# Patient Record
Sex: Female | Born: 1965 | Race: Black or African American | Hispanic: No | Marital: Single | Smoking: Never smoker
Health system: Southern US, Community
[De-identification: ages and names within clinical notes are randomized; demographics above are authoritative.]

## PROBLEM LIST (undated history)

## (undated) DIAGNOSIS — I10 Essential (primary) hypertension: Secondary | ICD-10-CM

## (undated) HISTORY — PX: SKIN BIOPSY: SHX1

## (undated) HISTORY — PX: TUBAL LIGATION: SHX77

## (undated) HISTORY — PX: KNEE SURGERY: SHX244

---

## 2002-04-24 ENCOUNTER — Encounter: Admission: RE | Admit: 2002-04-24 | Discharge: 2002-05-08 | Payer: Self-pay | Admitting: Specialist

## 2009-04-29 ENCOUNTER — Other Ambulatory Visit: Admission: RE | Admit: 2009-04-29 | Discharge: 2009-04-29 | Payer: Self-pay | Admitting: Obstetrics and Gynecology

## 2009-06-22 ENCOUNTER — Emergency Department (HOSPITAL_BASED_OUTPATIENT_CLINIC_OR_DEPARTMENT_OTHER): Admission: EM | Admit: 2009-06-22 | Discharge: 2009-06-22 | Payer: Self-pay | Admitting: Emergency Medicine

## 2010-03-11 ENCOUNTER — Emergency Department (HOSPITAL_BASED_OUTPATIENT_CLINIC_OR_DEPARTMENT_OTHER): Admission: EM | Admit: 2010-03-11 | Discharge: 2010-03-12 | Payer: Self-pay | Admitting: Emergency Medicine

## 2010-03-11 ENCOUNTER — Ambulatory Visit: Payer: Self-pay | Admitting: Diagnostic Radiology

## 2010-04-07 DIAGNOSIS — R079 Chest pain, unspecified: Secondary | ICD-10-CM | POA: Insufficient documentation

## 2010-04-07 DIAGNOSIS — H109 Unspecified conjunctivitis: Secondary | ICD-10-CM | POA: Insufficient documentation

## 2010-12-29 LAB — COMPREHENSIVE METABOLIC PANEL
ALT: 10 U/L (ref 0–35)
AST: 23 U/L (ref 0–37)
BUN: 11 mg/dL (ref 6–23)
Calcium: 8.8 mg/dL (ref 8.4–10.5)
GFR calc non Af Amer: >60 mL/min (ref >60)
Glucose, Bld: 95 mg/dL (ref 70–99)
Total Bilirubin: 0.7 mg/dL (ref 0.3–1.2)
Total Protein: 7.6 g/dL (ref 6.0–8.3)

## 2010-12-29 LAB — CBC
MCHC: 33.1 g/dL (ref 30.0–36.0)
MCV: 84.6 fL (ref 78.0–100.0)
RDW: 13.1 % (ref 11.5–15.5)

## 2010-12-29 LAB — DIFFERENTIAL
Basophils Relative: 0 % (ref 0–1)
Eosinophils Absolute: 0.2 10*3/uL (ref 0.0–0.7)
Eosinophils Relative: 2 % (ref 0–5)
Lymphs Abs: 2.9 10*3/uL (ref 0.7–4.0)
Monocytes Absolute: 0.4 10*3/uL (ref 0.1–1.0)
Monocytes Relative: 5 % (ref 3–12)

## 2010-12-29 LAB — POCT CARDIAC MARKERS
CKMB, poc: <1 ng/mL — ABNORMAL LOW (ref 1.0–8.0)
Troponin i, poc: <0.05 ng/mL (ref 0.00–0.09)

## 2010-12-29 LAB — URINALYSIS, ROUTINE W REFLEX MICROSCOPIC
Glucose, UA: NEGATIVE mg/dL
Leukocytes, UA: NEGATIVE
Specific Gravity, Urine: 1.018 (ref 1.005–1.030)

## 2010-12-29 LAB — URINE MICROSCOPIC-ADD ON

## 2011-08-25 IMAGING — CR DG CHEST 2V
2 series · 2 of 2 positions shown · non-contrast
Comparison: None.

CLINICAL DATA: Left shoulder pain.

CHEST - 2 VIEW

[w chest pa]
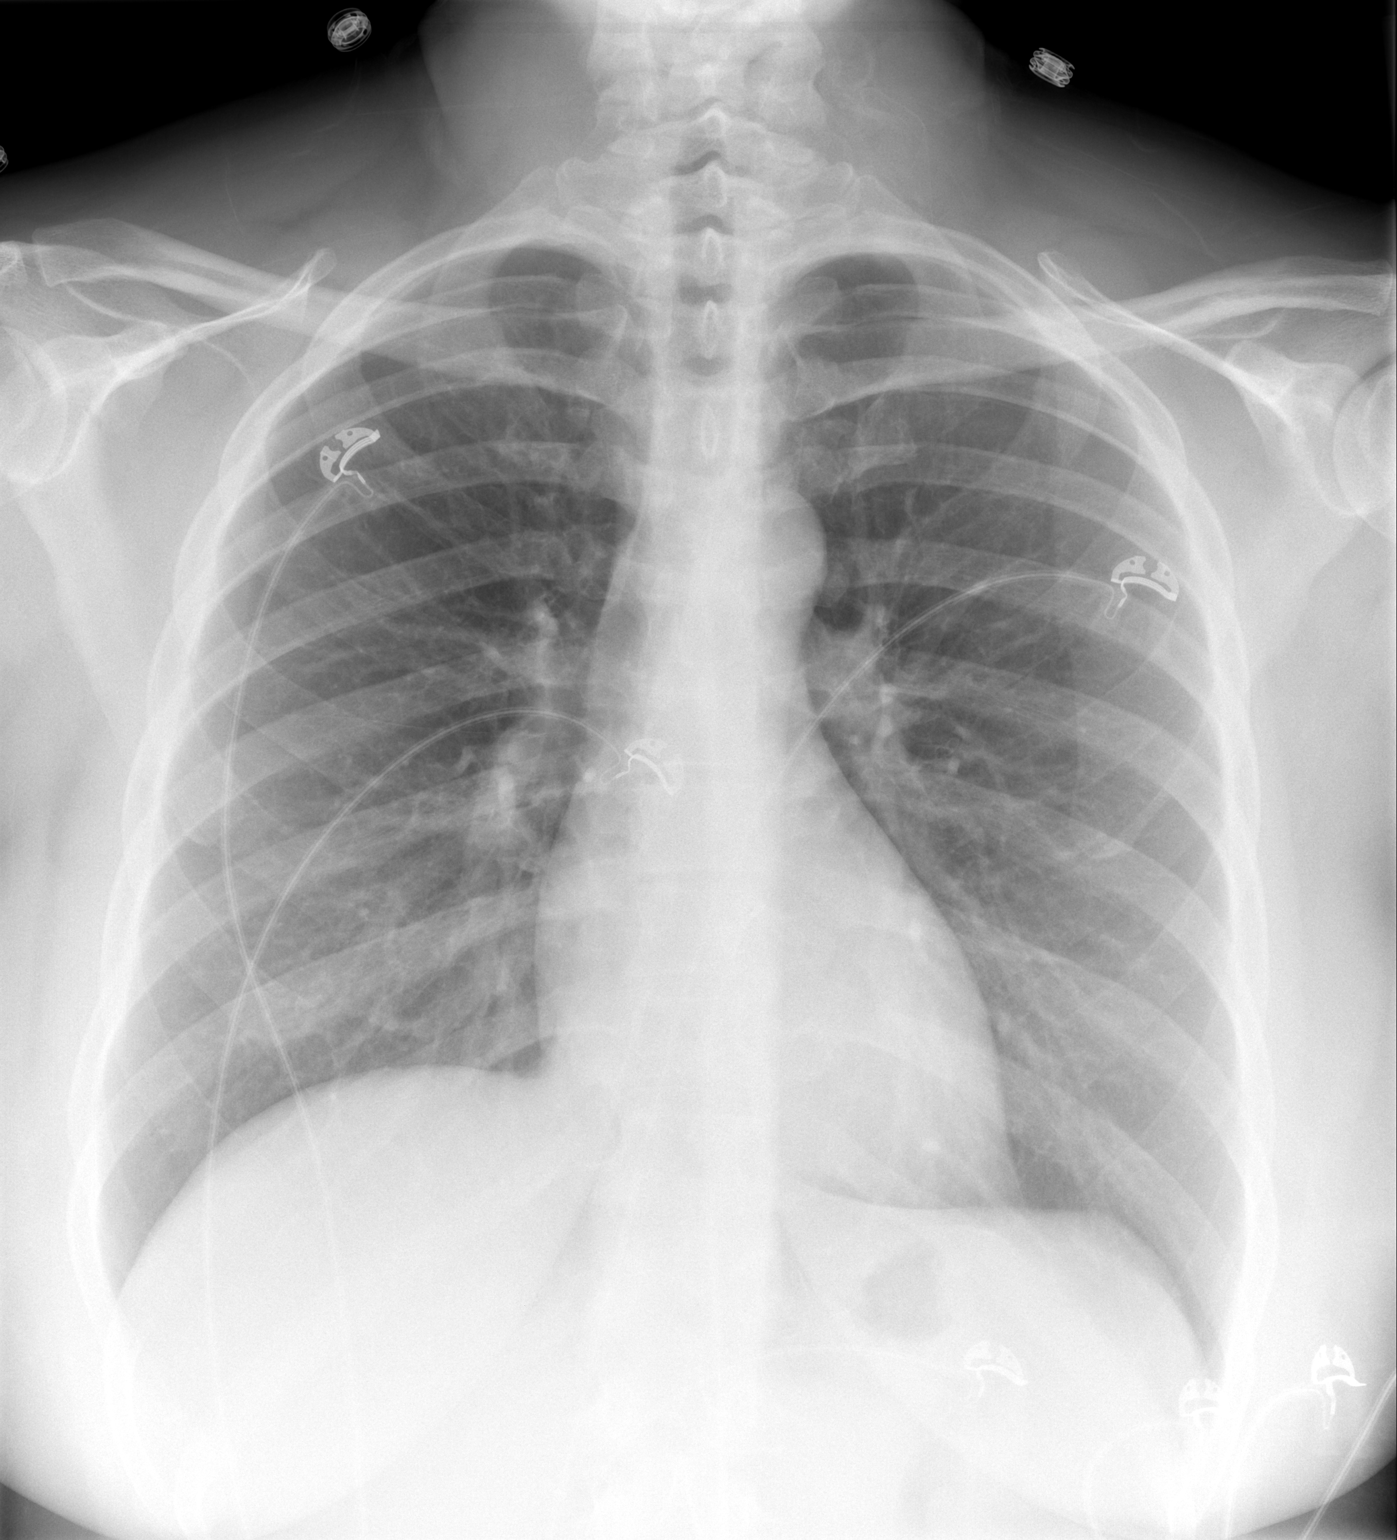

[w chest lat]
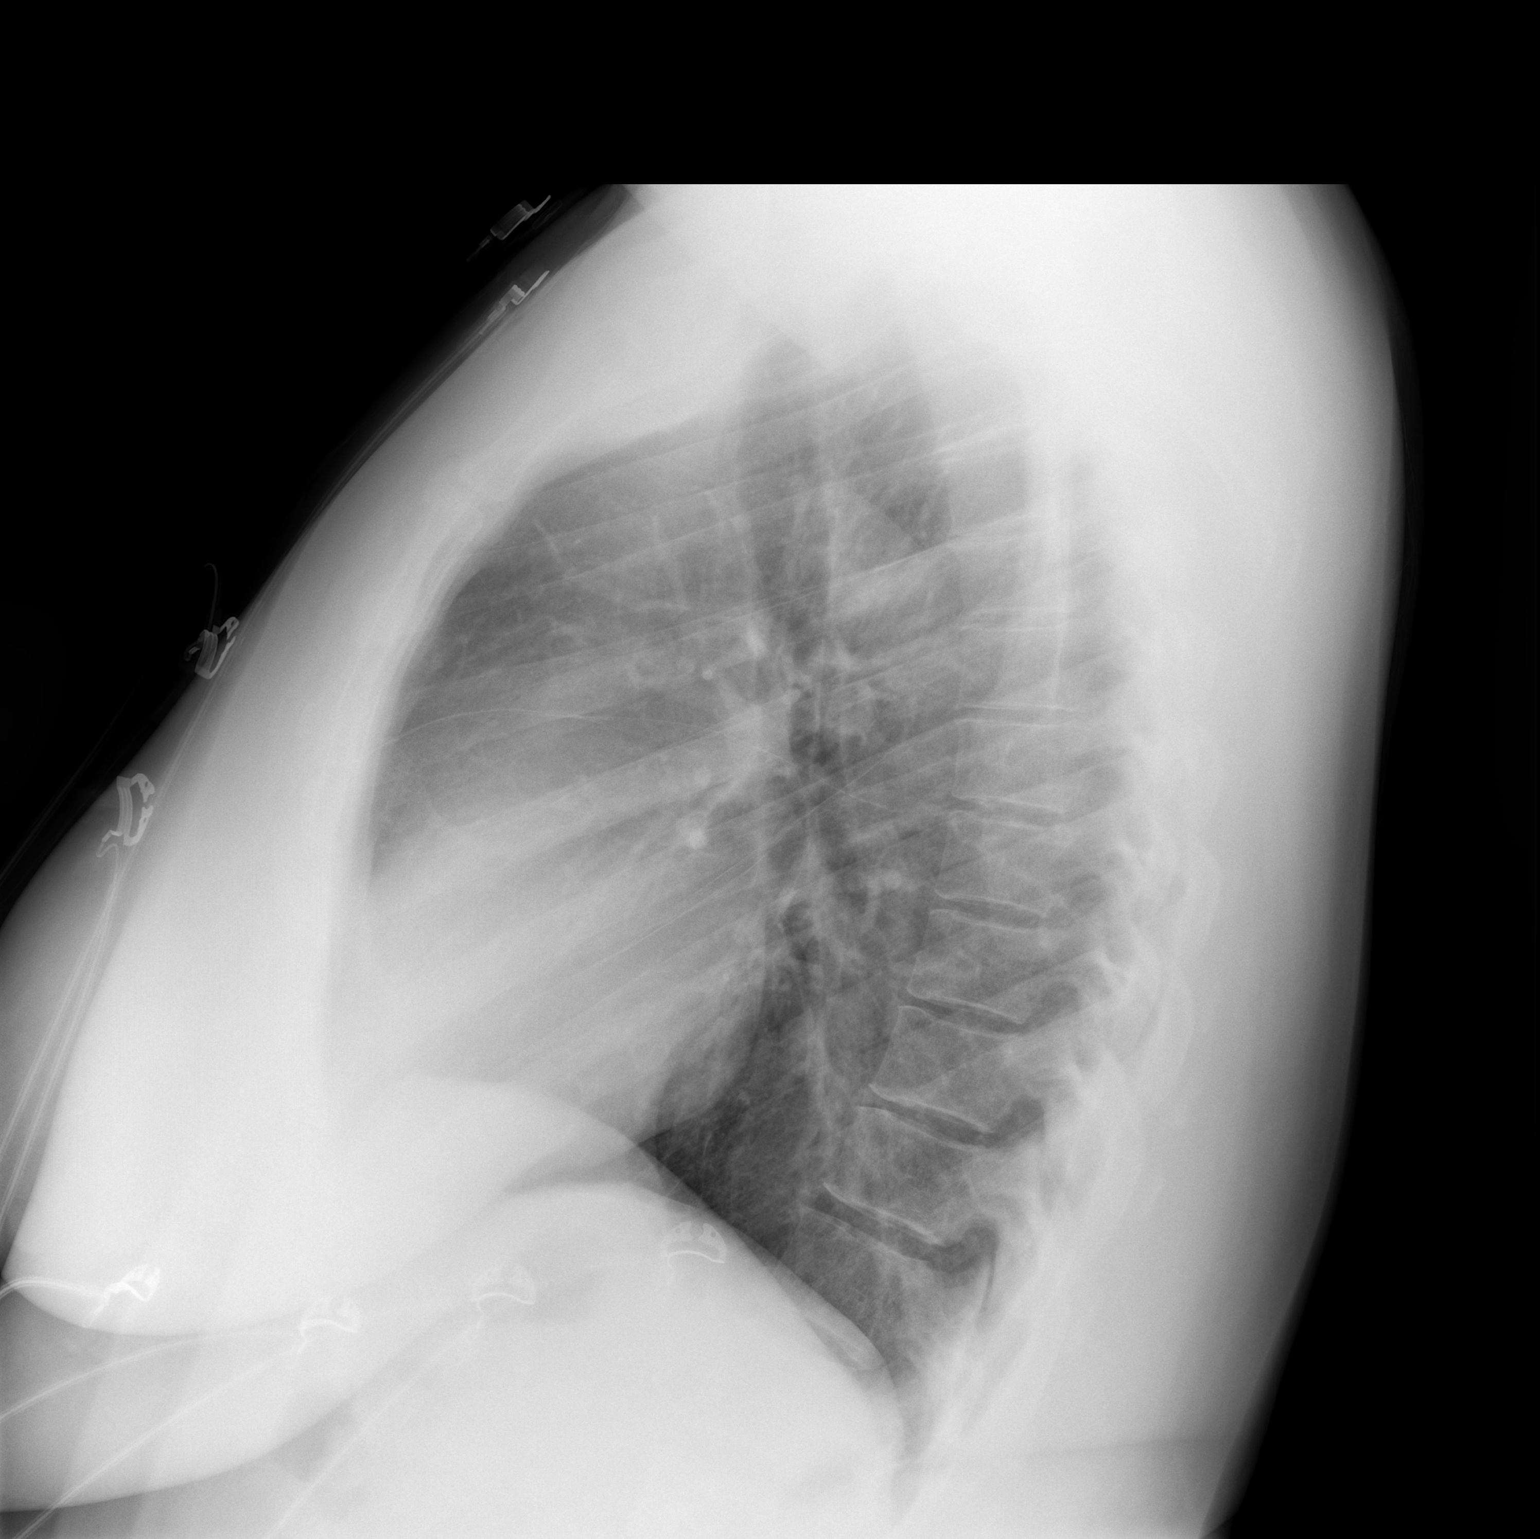

[2 of 2 positions shown; findings below may reference images not displayed]

FINDINGS: Two views of the chest were obtained.  Heart and
mediastinum are normal.  The lungs are clear.  Trachea is midline.
Bony structures are intact.
IMPRESSION: No acute chest findings.

## 2012-03-19 ENCOUNTER — Encounter (HOSPITAL_BASED_OUTPATIENT_CLINIC_OR_DEPARTMENT_OTHER): Payer: Self-pay | Admitting: *Deleted

## 2012-03-19 ENCOUNTER — Emergency Department (HOSPITAL_BASED_OUTPATIENT_CLINIC_OR_DEPARTMENT_OTHER)
Admission: EM | Admit: 2012-03-19 | Discharge: 2012-03-19 | Disposition: A | Discharge status: Home / Self Care | Payer: BC Managed Care – PPO | Attending: Emergency Medicine | Admitting: Emergency Medicine

## 2012-03-19 DIAGNOSIS — H109 Unspecified conjunctivitis: Secondary | ICD-10-CM

## 2012-03-19 DIAGNOSIS — I10 Essential (primary) hypertension: Secondary | ICD-10-CM | POA: Insufficient documentation

## 2012-03-19 HISTORY — DX: Essential (primary) hypertension: I10

## 2012-03-19 MED ORDER — TOBRAMYCIN 0.3 % OP SOLN: 1.0000 [drp] | OPHTHALMIC | Status: AC

## 2012-03-19 NOTE — ED Notes (Signed)
Pt c/o left eye irritation and drainage since yesterday

## 2012-03-19 NOTE — ED Provider Notes (Signed)
History     CSN: [id number]  Arrival date & time 03/19/12  2045   None     Chief Complaint  Patient presents with  . Eye Drainage    (Consider location/radiation/quality/duration/timing/severity/associated sxs/prior treatment) Patient is a 46 y.o. female presenting with conjunctivitis. The history is provided by the patient. No language interpreter was used.  Conjunctivitis  The current episode started today. The problem occurs occasionally. The problem is moderate. The symptoms are relieved by nothing. The symptoms are aggravated by nothing. Associated symptoms include eye pain and eye redness. The eye pain is moderate. There is pain in the left eye. The eyelid exhibits redness. There were no sick contacts. She has received no recent medical care.  Pt complains of redness to left eye.  Past Medical History  Diagnosis Date  . Hypertension     Past Surgical History  Procedure Date  . Knee surgery   . Skin biopsy   . Tubal ligation     History reviewed. No pertinent family history.  History  Substance Use Topics  . Smoking status: Never Smoker   . Smokeless tobacco: Not on file  . Alcohol Use: No    OB History    Grav Para Term Preterm Abortions TAB SAB Ect Mult Living                  Review of Systems  Eyes: Positive for pain and redness.  All other systems reviewed and are negative.    Allergies  Review of patient's allergies indicates not on file.  Home Medications  No current outpatient prescriptions on file.  BP 128/78  Pulse 79  Temp(Src) 98.3 F (36.8 C) (Oral)  Resp 18  Ht 5\' 8"  (1.727 m)  Wt 248 lb (112.492 kg)  BMI 37.71 kg/m2  SpO2 100%  LMP 03/02/2012  Physical Exam  Nursing note and vitals reviewed. Constitutional: She appears well-developed and well-nourished.  HENT:  Head: Normocephalic and atraumatic.  Right Ear: External ear normal.  Left Ear: External ear normal.  Nose: Nose normal.  Eyes: EOM are normal. Pupils are equal,  round, and reactive to light. Left eye exhibits discharge.  Neck: Normal range of motion.  Cardiovascular: Normal rate.   Pulmonary/Chest: Effort normal.  Neurological: She is alert.  Skin: Skin is warm.  Psychiatric: She has a normal mood and affect.    ED Course  Procedures (including critical care time)  Labs Reviewed - No data to display No results found.   1. Conjunctivitis       MDM  tobrex opth        Lonia Skinner [place], [place] 03/19/12 2301  Lonia Skinner [place], [place] 03/19/12 2303

## 2012-03-19 NOTE — Discharge Instructions (Signed)
Conjunctivitis Conjunctivitis is commonly called "pink eye." Conjunctivitis can be caused by bacterial or viral infection, allergies, or injuries. There is usually redness of the lining of the eye, itching, discomfort, and sometimes discharge. There may be deposits of matter along the eyelids. A viral infection usually causes a watery discharge, while a bacterial infection causes a yellowish, thick discharge. Pink eye is very contagious and spreads by direct contact. You may be given antibiotic eyedrops as part of your treatment. Before using your eye medicine, remove all drainage from the eye by washing gently with warm water and cotton balls. Continue to use the medication until you have awakened 2 mornings in a row without discharge from the eye. Do not rub your eye. This increases the irritation and helps spread infection. Use separate towels from other household members. Wash your hands with soap and water before and after touching your eyes. Use cold compresses to reduce pain and sunglasses to relieve irritation from light. Do not wear contact lenses or wear eye makeup until the infection is gone. SEEK MEDICAL CARE IF:   Your symptoms are not better after 3 days of treatment.   You have increased pain or trouble seeing.   The outer eyelids become very red or swollen.  Document Released: 11/05/2004 Document Revised: 09/17/2011 Document Reviewed: 09/28/2005 [employer] Patient Information 2012 [employer]. 

## 2012-03-22 NOTE — ED Provider Notes (Signed)
Medical screening examination/treatment/procedure(s) were performed by non-physician practitioner and as supervising physician I was immediately available for consultation/collaboration.  Cyndra Numbers, MD 03/22/12 [phone]

## 2012-12-15 ENCOUNTER — Emergency Department (HOSPITAL_BASED_OUTPATIENT_CLINIC_OR_DEPARTMENT_OTHER): Payer: BC Managed Care – PPO

## 2012-12-15 ENCOUNTER — Emergency Department (HOSPITAL_BASED_OUTPATIENT_CLINIC_OR_DEPARTMENT_OTHER)
Admission: EM | Admit: 2012-12-15 | Discharge: 2012-12-15 | Disposition: A | Discharge status: Home / Self Care | Payer: BC Managed Care – PPO | Attending: Emergency Medicine | Admitting: Emergency Medicine

## 2012-12-15 ENCOUNTER — Encounter (HOSPITAL_BASED_OUTPATIENT_CLINIC_OR_DEPARTMENT_OTHER): Payer: Self-pay | Admitting: *Deleted

## 2012-12-15 DIAGNOSIS — E876 Hypokalemia: Secondary | ICD-10-CM | POA: Insufficient documentation

## 2012-12-15 DIAGNOSIS — Z79899 Other long term (current) drug therapy: Secondary | ICD-10-CM | POA: Insufficient documentation

## 2012-12-15 DIAGNOSIS — R071 Chest pain on breathing: Secondary | ICD-10-CM | POA: Insufficient documentation

## 2012-12-15 DIAGNOSIS — R0789 Other chest pain: Secondary | ICD-10-CM

## 2012-12-15 DIAGNOSIS — I10 Essential (primary) hypertension: Secondary | ICD-10-CM | POA: Insufficient documentation

## 2012-12-15 LAB — BASIC METABOLIC PANEL
BUN: 11 mg/dL (ref 6–23)
Glucose, Bld: 112 mg/dL — ABNORMAL HIGH (ref 70–99)
Potassium: 3 mEq/L — ABNORMAL LOW (ref 3.5–5.1)
Sodium: 138 mEq/L (ref 135–145)

## 2012-12-15 LAB — CBC WITH DIFFERENTIAL/PLATELET
Basophils Absolute: 0 10*3/uL (ref 0.0–0.1)
Eosinophils Absolute: 0.2 10*3/uL (ref 0.0–0.7)
Eosinophils Relative: 3 % (ref 0–5)
HCT: 37.8 % (ref 36.0–46.0)
Hemoglobin: 12.9 g/dL (ref 12.0–15.0)
Lymphocytes Relative: 41 % (ref 12–46)
MCH: 27.8 pg (ref 26.0–34.0)
MCHC: 34.1 g/dL (ref 30.0–36.0)
MCV: 81.5 fL (ref 78.0–100.0)
Monocytes Absolute: 0.6 10*3/uL (ref 0.1–1.0)
Monocytes Relative: 7 % (ref 3–12)
Neutrophils Relative %: 49 % (ref 43–77)
RDW: 14.1 % (ref 11.5–15.5)

## 2012-12-15 LAB — D-DIMER, QUANTITATIVE: D-Dimer, Quant: <0.27 ug/mL-FEU (ref 0.00–0.48)

## 2012-12-15 MED ORDER — GI COCKTAIL ~~LOC~~
30.0000 mL | Freq: Once | ORAL | Status: AC
  Administered 2012-12-15: 30 mL via ORAL
  Filled 2012-12-15: qty 30

## 2012-12-15 MED ORDER — POTASSIUM CHLORIDE CRYS ER 20 MEQ PO TBCR: 20.0000 meq | EXTENDED_RELEASE_TABLET | Freq: Every day | ORAL | Status: AC

## 2012-12-15 MED ORDER — POTASSIUM CHLORIDE CRYS ER 20 MEQ PO TBCR
40.0000 meq | EXTENDED_RELEASE_TABLET | Freq: Once | ORAL | Status: AC
  Administered 2012-12-15: 40 meq via ORAL
  Filled 2012-12-15: qty 2

## 2012-12-15 MED ORDER — NAPROXEN 375 MG PO TABS: 375.0000 mg | ORAL_TABLET | Freq: Two times a day (BID) | ORAL | Status: AC

## 2012-12-15 MED ORDER — KETOROLAC TROMETHAMINE 30 MG/ML IJ SOLN
30.0000 mg | Freq: Once | INTRAMUSCULAR | Status: AC
  Administered 2012-12-15: 30 mg via INTRAVENOUS
  Filled 2012-12-15: qty 1

## 2012-12-15 NOTE — ED Notes (Signed)
MD at bedside. 

## 2012-12-15 NOTE — ED Notes (Signed)
Pt c/o right side chest pain that began yesterday. Pt unable to describe the quality of the pain and denies SOB, n/v or dizziness.

## 2012-12-15 NOTE — Discharge Instructions (Signed)
Chest Wall Pain Chest wall pain is pain in or around the bones and muscles of your chest. It may take up to 6 weeks to get better. It may take longer if you must stay physically active in your work and activities.  CAUSES  Chest wall pain may happen on its own. However, it may be caused by:  A viral illness like the flu.  Injury.  Coughing.  Exercise.  Arthritis.  Fibromyalgia.  Shingles. HOME CARE INSTRUCTIONS   Avoid overtiring physical activity. Try not to strain or perform activities that cause pain. This includes any activities using your chest or your abdominal and side muscles, especially if heavy weights are used.  Put ice on the sore area.  Put ice in a plastic bag.  Place a towel between your skin and the bag.  Leave the ice on for 15 to 20 minutes per hour while awake for the first 2 days.  Only take over-the-counter or prescription medicines for pain, discomfort, or fever as directed by your caregiver. SEEK IMMEDIATE MEDICAL CARE IF:   Your pain increases, or you are very uncomfortable.  You have a fever.  Your chest pain becomes worse.  You have new, unexplained symptoms.  You have nausea or vomiting.  You feel sweaty or lightheaded.  You have a cough with phlegm (sputum), or you cough up blood. MAKE SURE YOU:   Understand these instructions.  Will watch your condition.  Will get help right away if you are not doing well or get worse. Document Released: 09/28/2005 Document Revised: 12/21/2011 Document Reviewed: 05/25/2011 [employer] Patient Information 2013 [employer].  

## 2012-12-15 NOTE — ED Provider Notes (Signed)
History     CSN: [id number]  Arrival date & time 12/15/12  2130   First MD Initiated Contact with Patient 12/15/12 [phone]      Chief Complaint  Patient presents with  . Chest Pain    (Consider location/radiation/quality/duration/timing/severity/associated sxs/prior treatment) Patient is a 47 y.o. female presenting with chest pain. The history is provided by the patient.  Chest Pain Pain location:  R chest Pain quality: dull   Pain radiates to:  Does not radiate Pain radiates to the back: no   Pain severity:  Moderate Onset quality:  Gradual Duration:  1 day Timing:  Constant Progression:  Unchanged Chronicity:  New Context: not lifting   Relieved by:  Nothing Worsened by:  Certain positions Ineffective treatments:  None tried Associated symptoms: no abdominal pain, no back pain, no cough, no diaphoresis, no dizziness, no fever, no shortness of breath and not vomiting   Risk factors: not female and no smoking     Past Medical History  Diagnosis Date  . Hypertension     Past Surgical History  Procedure Laterality Date  . Knee surgery    . Skin biopsy    . Tubal ligation      No family history on file.  History  Substance Use Topics  . Smoking status: Never Smoker   . Smokeless tobacco: Not on file  . Alcohol Use: No    OB History   Grav Para Term Preterm Abortions TAB SAB Ect Mult Living                  Review of Systems  Constitutional: Negative for fever and diaphoresis.  Respiratory: Negative for cough and shortness of breath.   Cardiovascular: Positive for chest pain.  Gastrointestinal: Negative for vomiting and abdominal pain.  Musculoskeletal: Negative for back pain.  Neurological: Negative for dizziness.  All other systems reviewed and are negative.    Allergies  Review of patient's allergies indicates no known allergies.  Home Medications   Current Outpatient Rx  Name  Route  Sig  Dispense  Refill  . hydrochlorothiazide (HYDRODIURIL)  25 MG tablet   Oral   Take 25 mg by mouth daily.           BP 154/90  Pulse 92  Temp(Src) 98 F (36.7 C) (Oral)  Resp 20  Ht 5\' 9"  (1.753 m)  Wt 247 lb 8 oz (112.265 kg)  BMI 36.53 kg/m2  SpO2 99%  LMP 12/05/2012  Physical Exam  Constitutional: She is oriented to person, place, and time. She appears well-developed and well-nourished. No distress.  HENT:  Head: Normocephalic and atraumatic.  Mouth/Throat: Oropharynx is clear and moist.  Eyes: Conjunctivae are normal. Pupils are equal, round, and reactive to light.  Neck: Normal range of motion. Neck supple.  Cardiovascular: Normal rate, regular rhythm and intact distal pulses.   Pulmonary/Chest: Effort normal and breath sounds normal. She has no wheezes. She has no rales.  Abdominal: Soft. Bowel sounds are normal. There is no tenderness. There is no rebound.  Musculoskeletal: Normal range of motion. She exhibits no edema.  Neurological: She is alert and oriented to person, place, and time.  Skin: Skin is warm. She is not diaphoretic.  Psychiatric: She has a normal mood and affect.    ED Course  Procedures (including critical care time)  Labs Reviewed  CBC WITH DIFFERENTIAL  BASIC METABOLIC PANEL  TROPONIN I   No results found.   No diagnosis found.  MDM   Date: 12/15/2012  Rate: 89  Rhythm: normal sinus rhythm  QRS Axis: normal  Intervals: normal  ST/T Wave abnormalities: normal  Conduction Disutrbances: none  Narrative Interpretation: PVC    In the setting of ongoing symptoms pf > 8 hrs constant duration with negative EKG and troponin ACS is excluded.  Suspect MSK etiology for symptoms.  Pain improved with meds will prescribe potassium supplement and should follow up for recheck of potassium       April K Palumbo-Rasch, MD 12/15/12 [phone]

## 2014-05-31 IMAGING — CR DG CHEST 2V
2 series · 2 of 2 positions shown · non-contrast
Comparison: Chest radiograph performed 03/11/2010

CLINICAL DATA: Right-sided chest pain.

CHEST - 2 VIEW

[w chest pa]
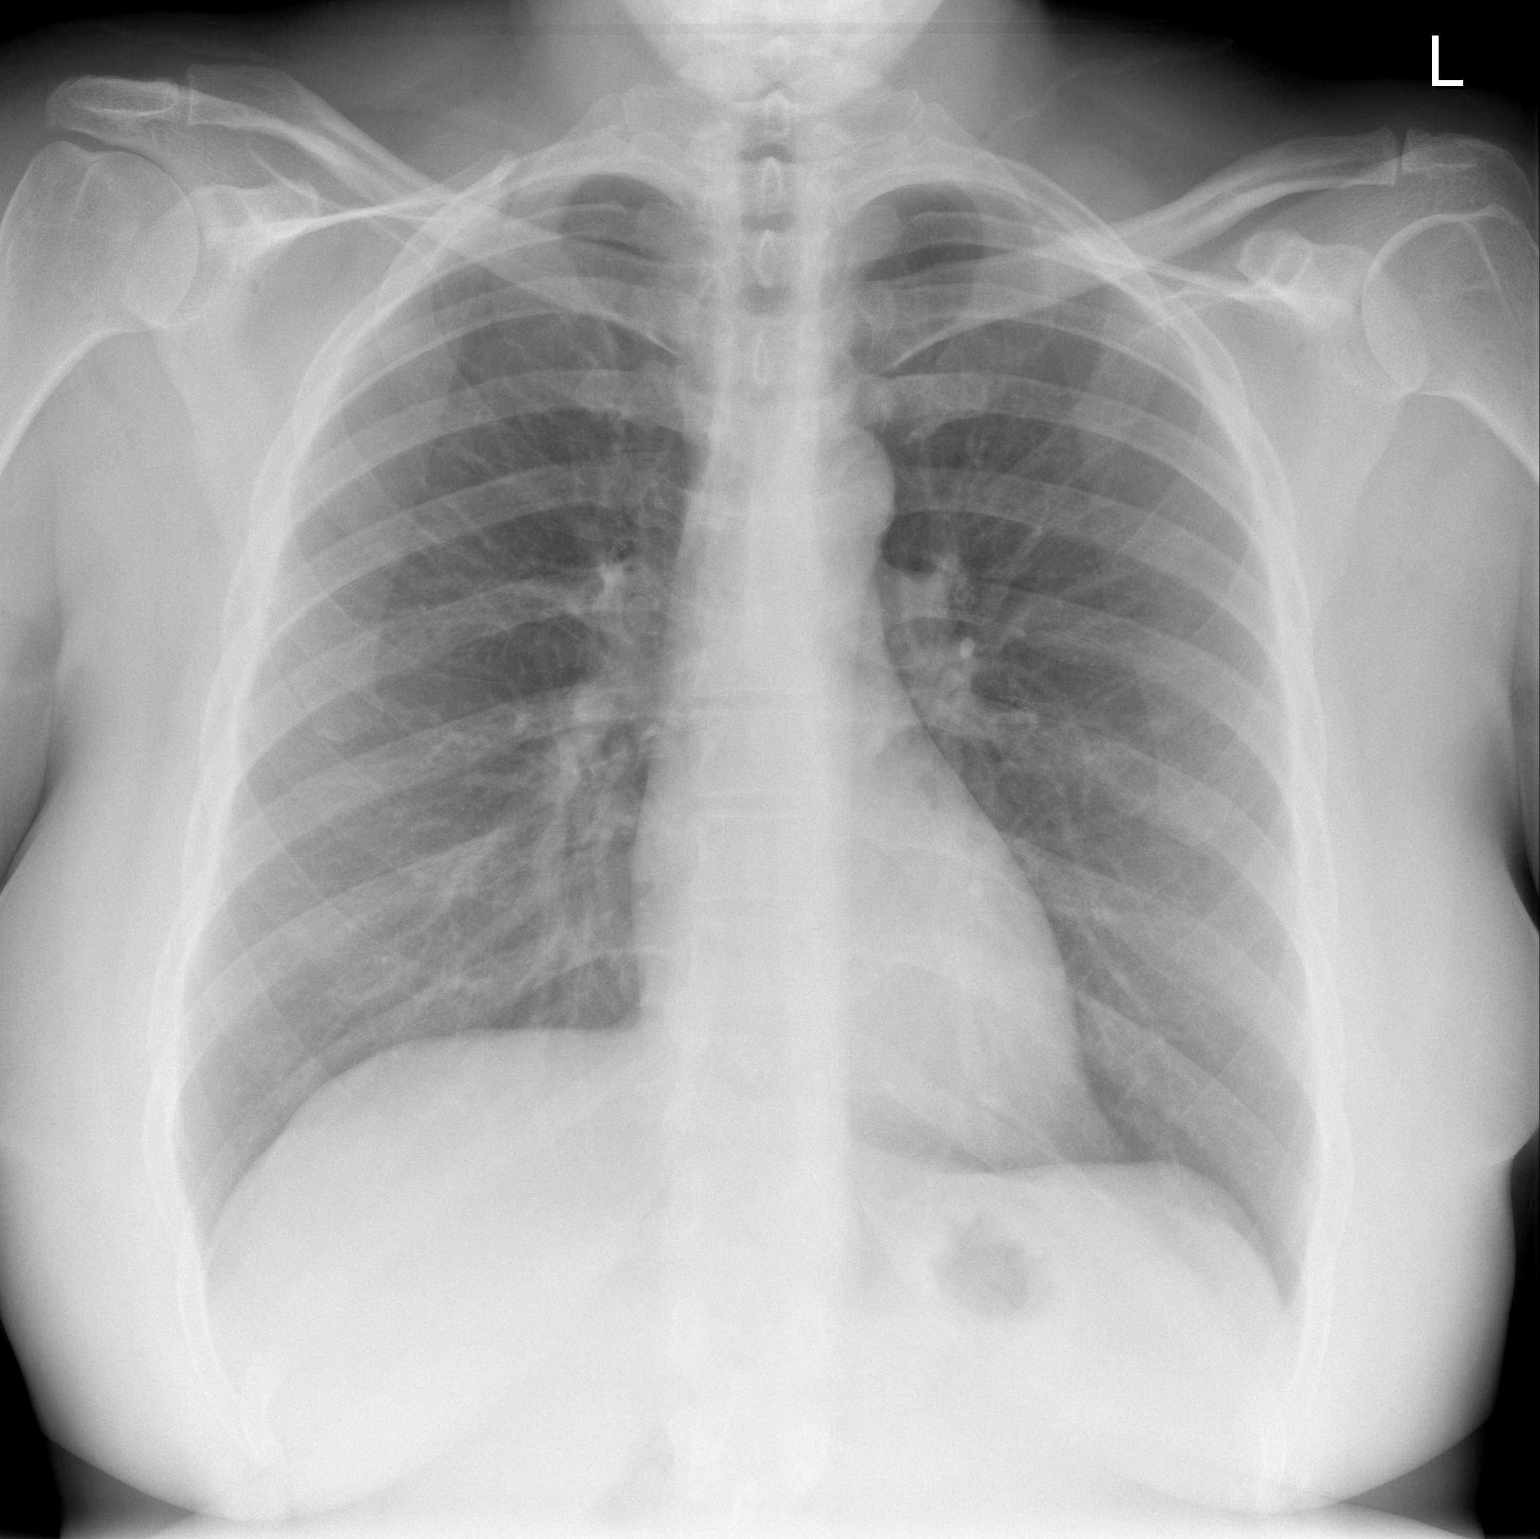

[w chest lat]
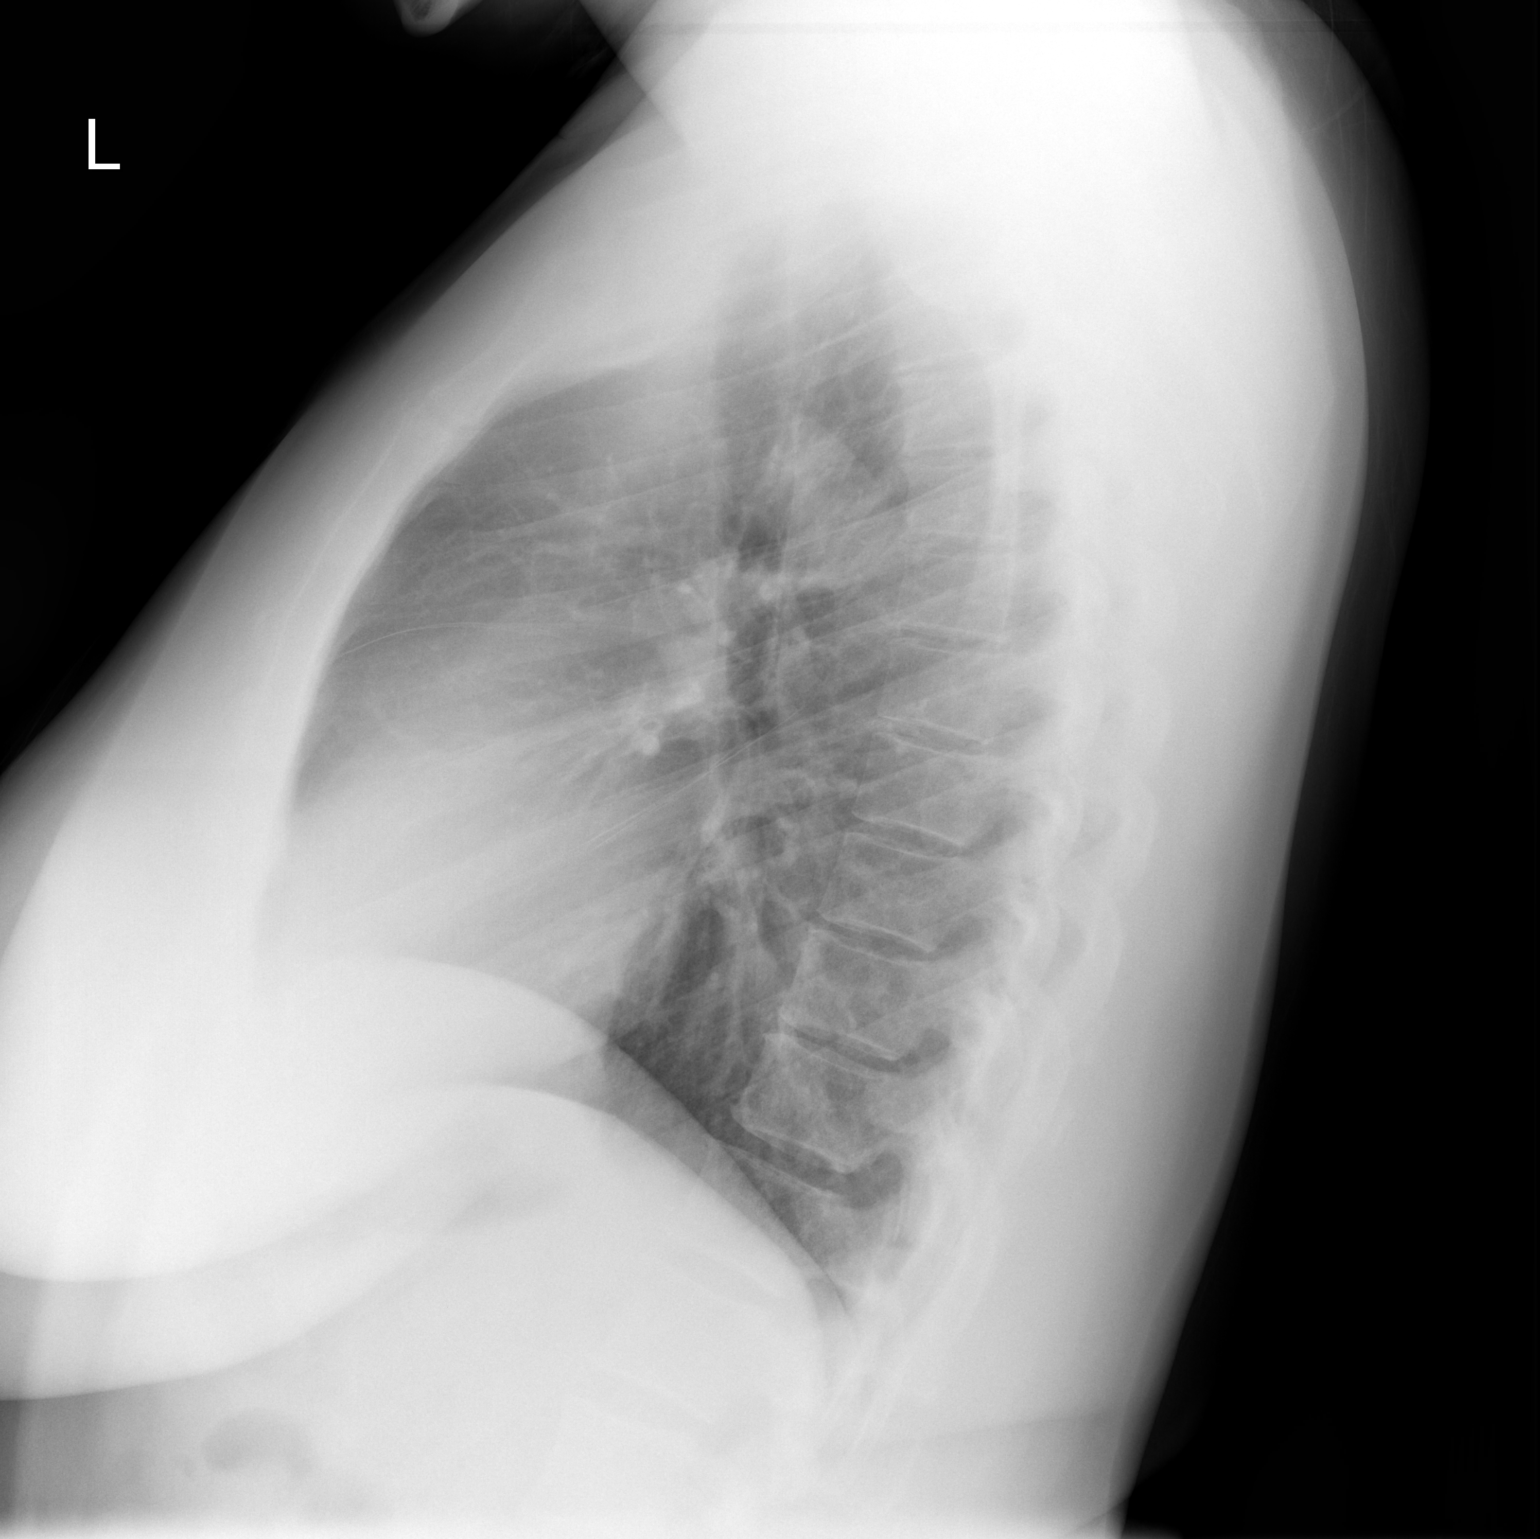

[2 of 2 positions shown; findings below may reference images not displayed]

FINDINGS: The lungs are well-aerated and clear.  There is no
evidence of focal opacification, pleural effusion or pneumothorax.

The heart is normal in size; the mediastinal contour is within
normal limits.  No acute osseous abnormalities are seen.
IMPRESSION: No acute cardiopulmonary process seen.

## 2015-06-24 ENCOUNTER — Encounter (HOSPITAL_BASED_OUTPATIENT_CLINIC_OR_DEPARTMENT_OTHER): Payer: Self-pay | Admitting: Emergency Medicine

## 2015-06-24 ENCOUNTER — Emergency Department (HOSPITAL_BASED_OUTPATIENT_CLINIC_OR_DEPARTMENT_OTHER)
Admission: EM | Admit: 2015-06-24 | Discharge: 2015-06-24 | Disposition: A | Discharge status: Home / Self Care | Payer: BC Managed Care – PPO | Attending: Emergency Medicine | Admitting: Emergency Medicine

## 2015-06-24 DIAGNOSIS — I10 Essential (primary) hypertension: Secondary | ICD-10-CM | POA: Insufficient documentation

## 2015-06-24 DIAGNOSIS — L03113 Cellulitis of right upper limb: Secondary | ICD-10-CM | POA: Diagnosis not present

## 2015-06-24 DIAGNOSIS — T63481A Toxic effect of venom of other arthropod, accidental (unintentional), initial encounter: Secondary | ICD-10-CM | POA: Insufficient documentation

## 2015-06-24 DIAGNOSIS — Y998 Other external cause status: Secondary | ICD-10-CM | POA: Diagnosis not present

## 2015-06-24 DIAGNOSIS — Y9289 Other specified places as the place of occurrence of the external cause: Secondary | ICD-10-CM | POA: Diagnosis not present

## 2015-06-24 DIAGNOSIS — Z791 Long term (current) use of non-steroidal anti-inflammatories (NSAID): Secondary | ICD-10-CM | POA: Diagnosis not present

## 2015-06-24 DIAGNOSIS — Z79899 Other long term (current) drug therapy: Secondary | ICD-10-CM | POA: Insufficient documentation

## 2015-06-24 DIAGNOSIS — Y9389 Activity, other specified: Secondary | ICD-10-CM | POA: Diagnosis not present

## 2015-06-24 DIAGNOSIS — Z23 Encounter for immunization: Secondary | ICD-10-CM | POA: Diagnosis not present

## 2015-06-24 MED ORDER — VANCOMYCIN HCL IN DEXTROSE 1-5 GM/200ML-% IV SOLN
1000.0000 mg | Freq: Once | INTRAVENOUS | Status: AC
  Administered 2015-06-24: 1000 mg via INTRAVENOUS
  Filled 2015-06-24: qty 200

## 2015-06-24 MED ORDER — DOXYCYCLINE HYCLATE 100 MG PO TABS
100.0000 mg | ORAL_TABLET | Freq: Once | ORAL | Status: AC
  Administered 2015-06-24: 100 mg via ORAL
  Filled 2015-06-24: qty 1

## 2015-06-24 MED ORDER — DEXAMETHASONE SODIUM PHOSPHATE 10 MG/ML IJ SOLN
10.0000 mg | Freq: Once | INTRAMUSCULAR | Status: AC
  Administered 2015-06-24: 10 mg via INTRAVENOUS
  Filled 2015-06-24: qty 1

## 2015-06-24 MED ORDER — DOXYCYCLINE HYCLATE 100 MG PO CAPS: 100.0000 mg | ORAL_CAPSULE | Freq: Two times a day (BID) | ORAL | Status: AC

## 2015-06-24 MED ORDER — VANCOMYCIN HCL 1000 MG IV SOLR
INTRAVENOUS | Status: DC
  Filled 2015-06-24: qty 1000

## 2015-06-24 MED ORDER — TETANUS-DIPHTH-ACELL PERTUSSIS 5-2.5-18.5 LF-MCG/0.5 IM SUSP
0.5000 mL | Freq: Once | INTRAMUSCULAR | Status: AC
  Administered 2015-06-24: 0.5 mL via INTRAMUSCULAR
  Filled 2015-06-24: qty 0.5

## 2015-06-24 NOTE — ED Notes (Signed)
Bee sting on forearm with concentration of erythema extending upward. Hot and swollen. Radial pulse strong. Denies any difficulty breathing.

## 2015-06-24 NOTE — ED Notes (Signed)
Pt c/o right forearm redness and swelling after being stung by bee on Friday. Pt reports that she did not take any medication after being stung but has taken sudafed for congestion since the sting. Denies oral swelling. Hx HTN.

## 2015-06-24 NOTE — ED Notes (Signed)
MD at bedside. 

## 2015-06-24 NOTE — ED Provider Notes (Signed)
CSN: [id number]     Arrival date & time 06/24/15  4098 History   First MD Initiated Contact with Patient 06/24/15 0547     Chief Complaint  Patient presents with  . 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 She had a B Landau on her right forearm and when she went to swat it it stung her. Since then she has developed swelling, erythema and a moderate burning pain to the right forearm; pain is worse with palpation or movement. The erythema involves about 50% of her forearm and is not circumferential. She has had some nasal congestion for which she has taken Sudafed. Otherwise she has had no systemic or respiratory symptoms. She has not taken any other medications.  Past Medical History  Diagnosis Date  . Hypertension    Past Surgical History  Procedure Laterality Date  . Knee surgery    . Skin biopsy    . Tubal ligation     No family history on file. Social History  Substance Use Topics  . Smoking status: Never Smoker   . Smokeless tobacco: None  . Alcohol Use: No   OB History    No data available     Review of Systems  All other systems reviewed and are negative.   Allergies  Review of patient's allergies indicates no known allergies.  Home Medications   Prior to Admission medications   Medication Sig Start Date End Date Taking? Authorizing Provider  hydrochlorothiazide (HYDRODIURIL) 25 MG tablet Take 25 mg by mouth daily.    Historical Provider, MD  naproxen (NAPROSYN) 375 MG tablet Take 1 tablet (375 mg total) by mouth 2 (two) times daily with a meal. 12/15/12   April Palumbo, MD  potassium chloride SA (K-DUR,KLOR-CON) 20 MEQ tablet Take 1 tablet (20 mEq total) by mouth daily. 12/15/12   April Palumbo, MD   BP 128/83 mmHg  Pulse 78  Temp(Src) 98.3 F (36.8 C) (Oral)  Resp 18  Ht  (1.753 m)  Wt 245 lb (111.131 kg)  BMI  36.16 kg/m2  SpO2 100%  LMP 06/05/2015   Physical Exam  General: Well-developed, well-nourished female in no acute distress; appearance consistent with age of record HENT: normocephalic; atraumatic Eyes: Normal appearance Neck: supple Heart: regular rate and rhythm Lungs: clear to auscultation bilaterally Abdomen: soft; nondistended; nontender; bowel sounds present Extremities: No deformity; full range of motion Neurologic: Awake, alert and oriented; motor function intact in all extremities and symmetric; no facial droop Skin: Warm and dry; erythema, warmth, tenderness and swelling of right forearm:    Psychiatric: Normal mood and affect    ED Course  Procedures (including critical care time)   MDM  6:24 AM Vancomycin 1 gram IV given for possible cellulitis. Dexamethasone 10 milligrams given IV for possible local allergic reaction. We will discharge on antibiotics and have her return if symptoms worsen. Her tetanus is been updated.       Paula Libra, MD 06/24/15 [phone]
